# Patient Record
Sex: Female | Born: 1960 | Race: White | Hispanic: No | Marital: Single
Health system: Southern US, Community
[De-identification: ages and names within clinical notes are randomized; demographics above are authoritative.]

---

## 2001-03-13 ENCOUNTER — Other Ambulatory Visit: Admission: RE | Admit: 2001-03-13 | Discharge: 2001-03-13 | Payer: Self-pay | Admitting: Obstetrics and Gynecology

## 2001-03-24 ENCOUNTER — Encounter (INDEPENDENT_AMBULATORY_CARE_PROVIDER_SITE_OTHER): Payer: Self-pay

## 2001-03-24 ENCOUNTER — Ambulatory Visit (HOSPITAL_COMMUNITY): Admission: RE | Admit: 2001-03-24 | Discharge: 2001-03-24 | Payer: Self-pay | Admitting: Obstetrics and Gynecology

## 2001-03-24 ENCOUNTER — Encounter: Payer: Self-pay | Admitting: Obstetrics and Gynecology

## 2003-02-02 ENCOUNTER — Other Ambulatory Visit: Admission: RE | Admit: 2003-02-02 | Discharge: 2003-02-02 | Payer: Self-pay | Admitting: Obstetrics and Gynecology

## 2003-02-10 ENCOUNTER — Ambulatory Visit (HOSPITAL_COMMUNITY): Admission: RE | Admit: 2003-02-10 | Discharge: 2003-02-10 | Payer: Self-pay | Admitting: *Deleted

## 2003-02-10 ENCOUNTER — Encounter (INDEPENDENT_AMBULATORY_CARE_PROVIDER_SITE_OTHER): Payer: Self-pay

## 2004-11-28 ENCOUNTER — Other Ambulatory Visit: Admission: RE | Admit: 2004-11-28 | Discharge: 2004-11-28 | Payer: Self-pay | Admitting: Obstetrics and Gynecology

## 2005-11-29 ENCOUNTER — Emergency Department (HOSPITAL_COMMUNITY): Admission: EM | Admit: 2005-11-29 | Discharge: 2005-11-29 | Payer: Self-pay | Admitting: Emergency Medicine

## 2005-12-18 ENCOUNTER — Other Ambulatory Visit: Admission: RE | Admit: 2005-12-18 | Discharge: 2005-12-18 | Payer: Self-pay | Admitting: Obstetrics and Gynecology

## 2006-02-11 ENCOUNTER — Ambulatory Visit (HOSPITAL_COMMUNITY): Admission: RE | Admit: 2006-02-11 | Discharge: 2006-02-11 | Payer: Self-pay | Admitting: *Deleted

## 2007-03-30 IMAGING — CT CT HEAD W/O CM
1 series · 16 of 30 positions shown, 20 images · IV contrast (agent unspecified)
Comparison: None available.

CLINICAL DATA: Fall.  Headache.  Diplopia.  
 HEAD CT WITHOUT CONTRAST:
TECHNIQUE: Contiguous axial images were obtained from the base of the skull through the vertex according to standard protocol without contrast.

[Series 2: head_seq 4.5 h45s st · axial · 0.43mm/px · z∈[+1227,+1371]mm · 16 of 36 slices shown, 20 images]
[im 2/36  brain]
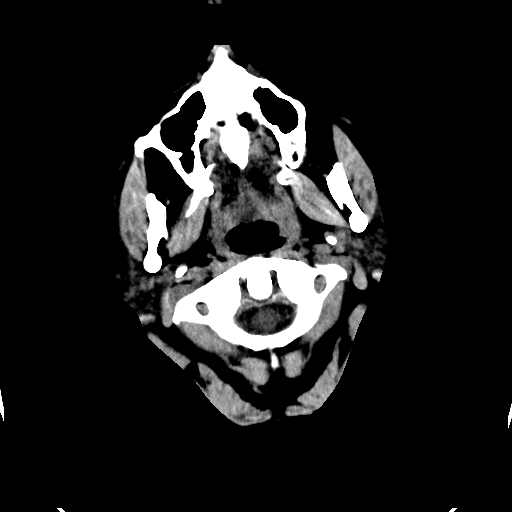
[im 2/36  bone]
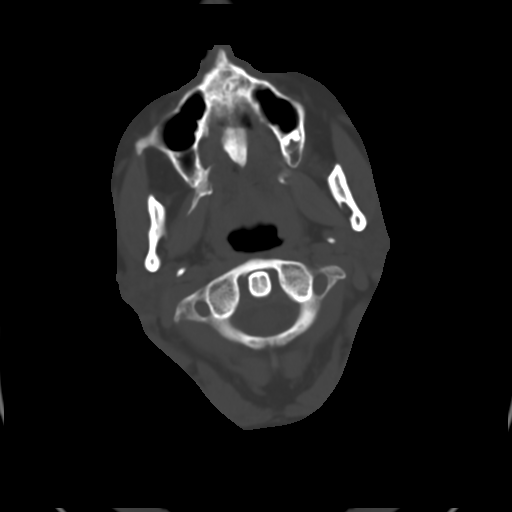
[im 4/36  brain]
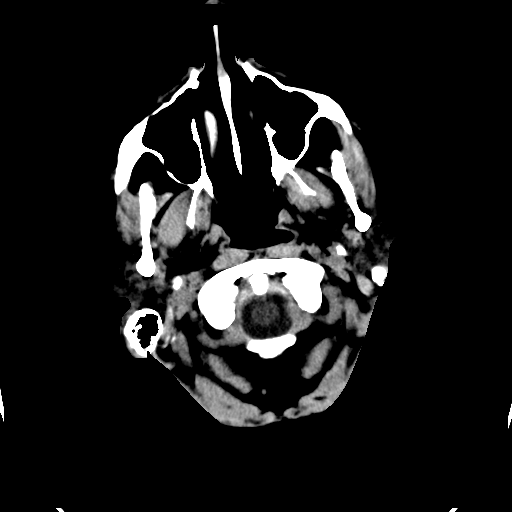
[im 7/36  brain]
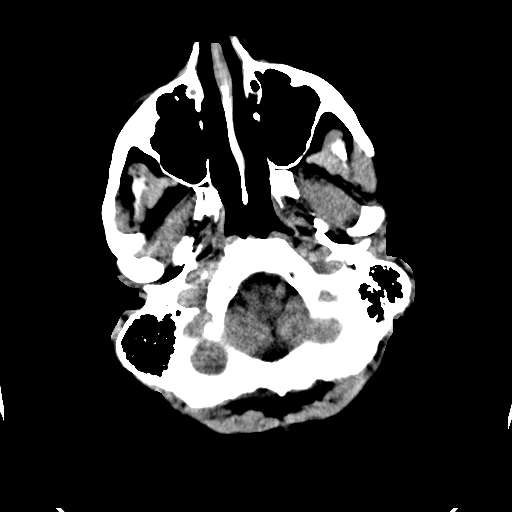
[im 9/36  brain]
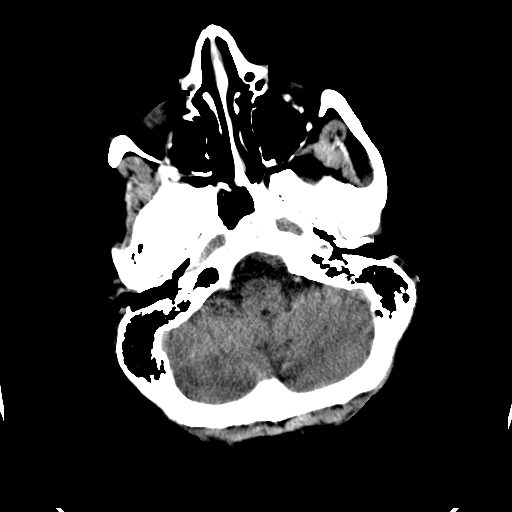
[im 10/36  brain]
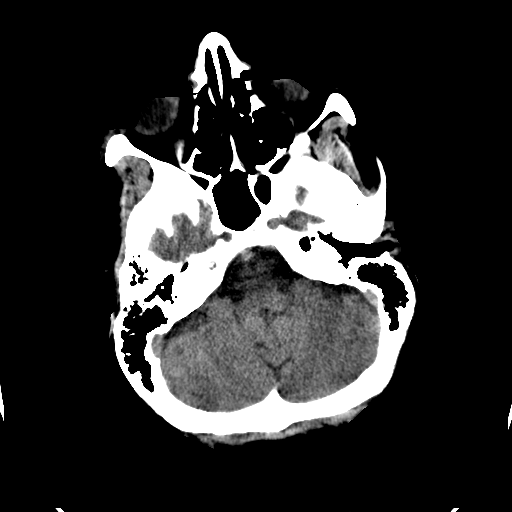
[im 10/36  bone]
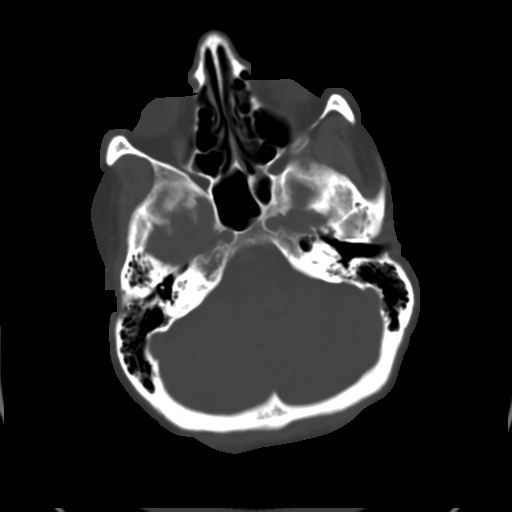
[im 13/36  brain]
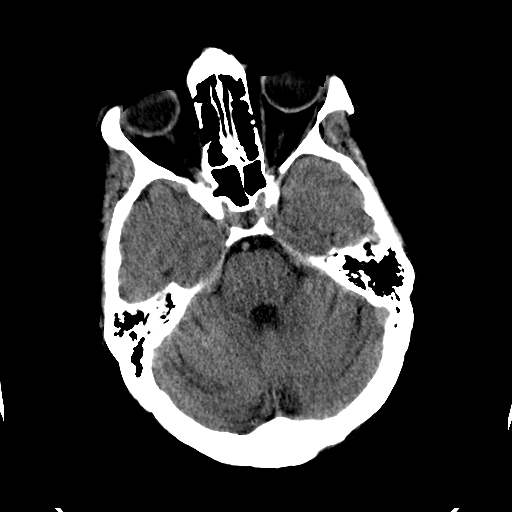
[im 15/36  brain]
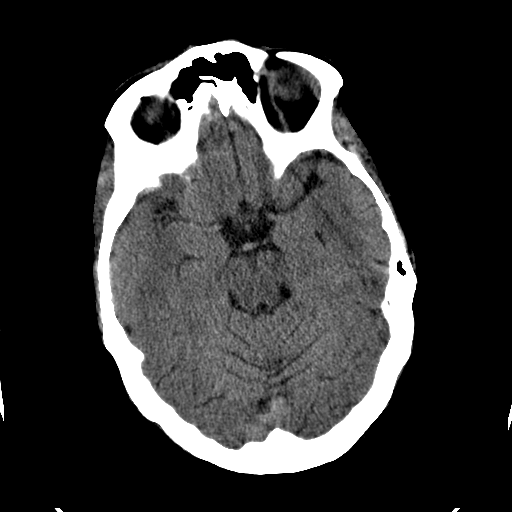
[im 17/36  brain]
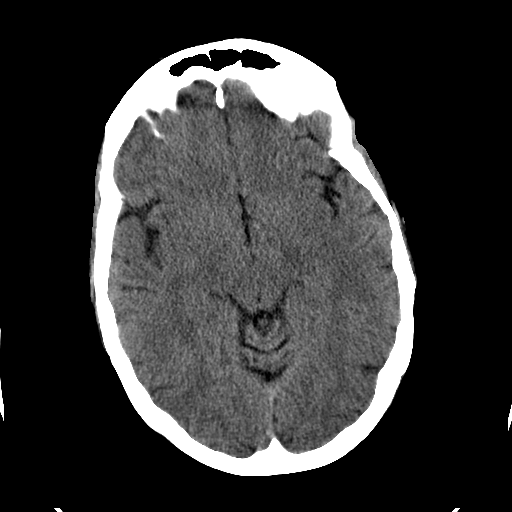
[im 19/36  brain]
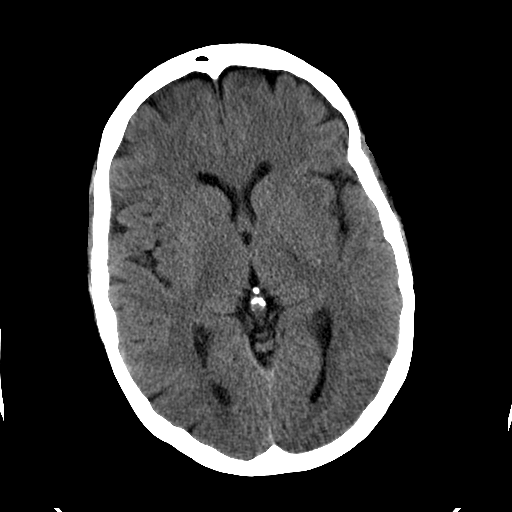
[im 19/36  bone]
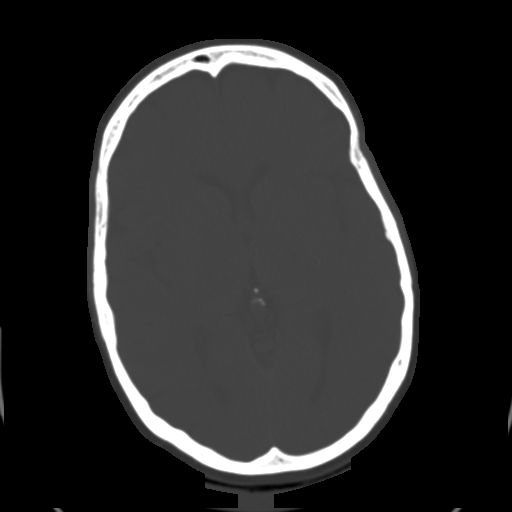
[im 21/36  brain]
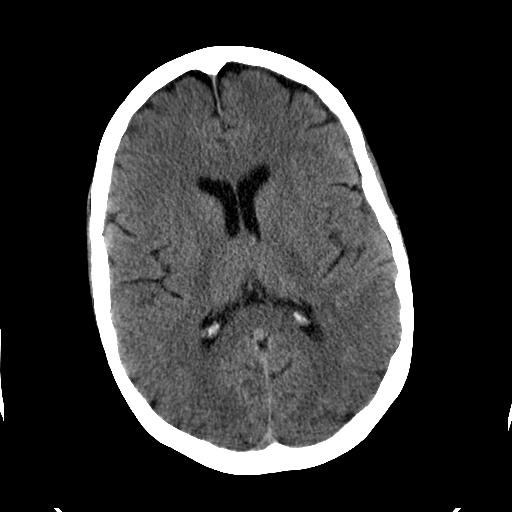
[im 23/36  brain]
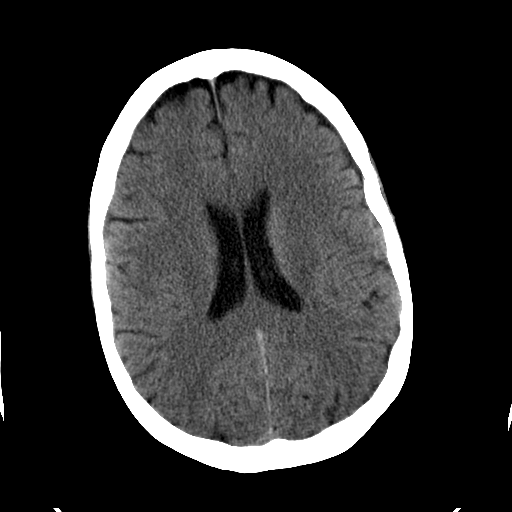
[im 26/36  brain]
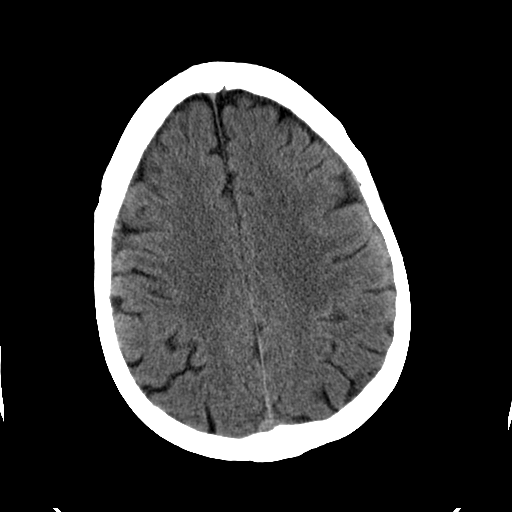
[im 27/36  brain]
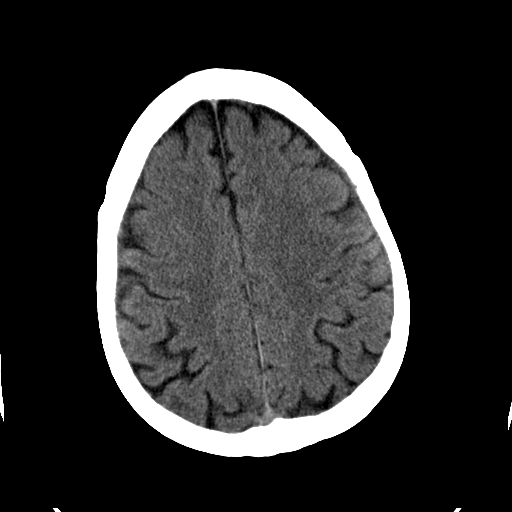
[im 27/36  bone]
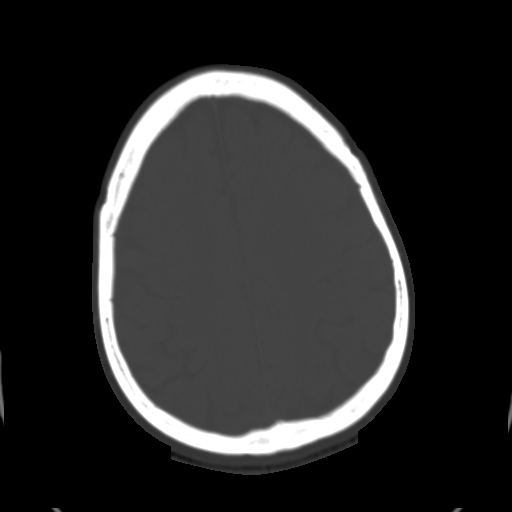
[im 29/36  brain]
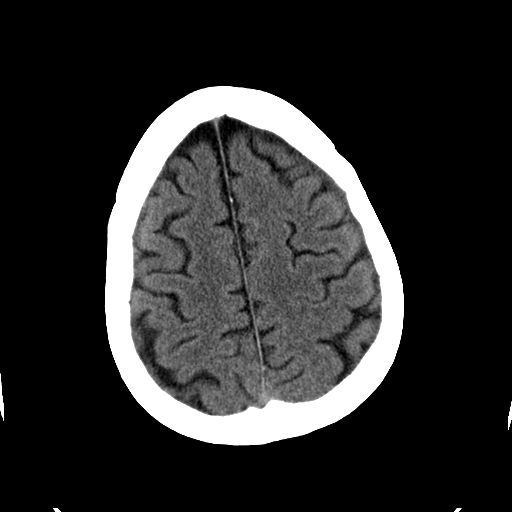
[im 32/36  brain]
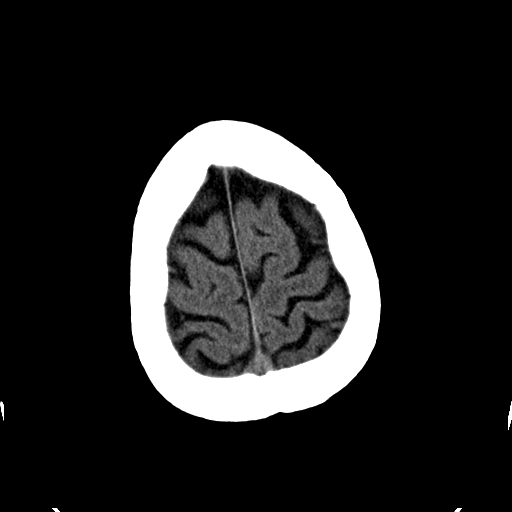
[im 34/36  brain]
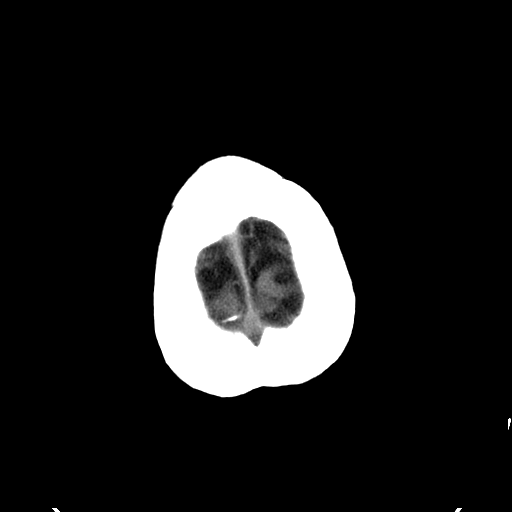

[16 of 30 positions shown; findings below may reference images not displayed]

There is no evidence of intracranial hemorrhage, brain edema, or mass effect.  No other intra-axial abnormalities are seen, and the ventricles are within normal limits.  No abnormal extra-axial fluid collections or masses are identified.  No skull abnormalities are noted.
IMPRESSION: Negative non-contrast head CT.

## 2009-07-28 ENCOUNTER — Ambulatory Visit: Payer: Self-pay | Admitting: Radiology

## 2009-07-28 ENCOUNTER — Emergency Department (HOSPITAL_BASED_OUTPATIENT_CLINIC_OR_DEPARTMENT_OTHER): Admission: EM | Admit: 2009-07-28 | Discharge: 2009-07-28 | Payer: Self-pay | Admitting: Emergency Medicine

## 2010-09-13 IMAGING — CR DG CHEST 2V
2 series · 2 of 2 positions shown · non-contrast
Comparison: None

CLINICAL DATA: Coughing congestion.  Smoker.  Abdominal pain.

CHEST - 2 VIEW

[w chest pa]
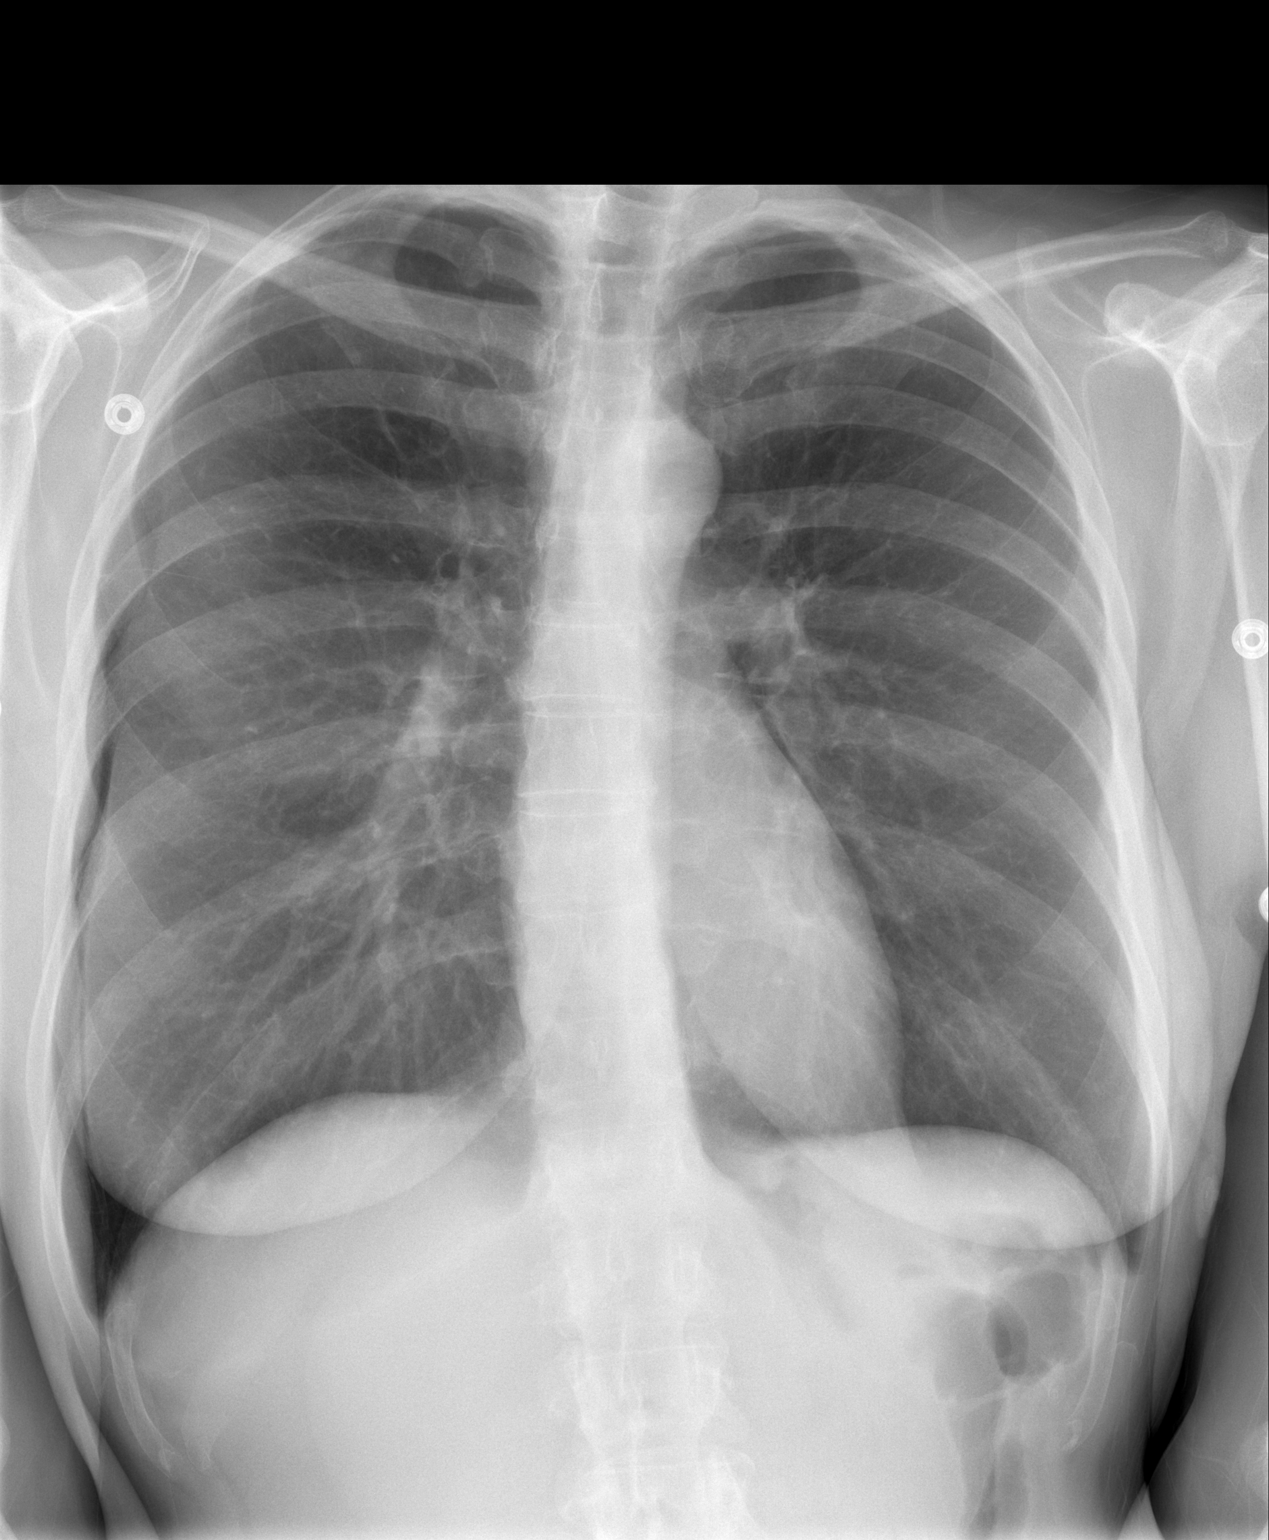

[w chest lat]
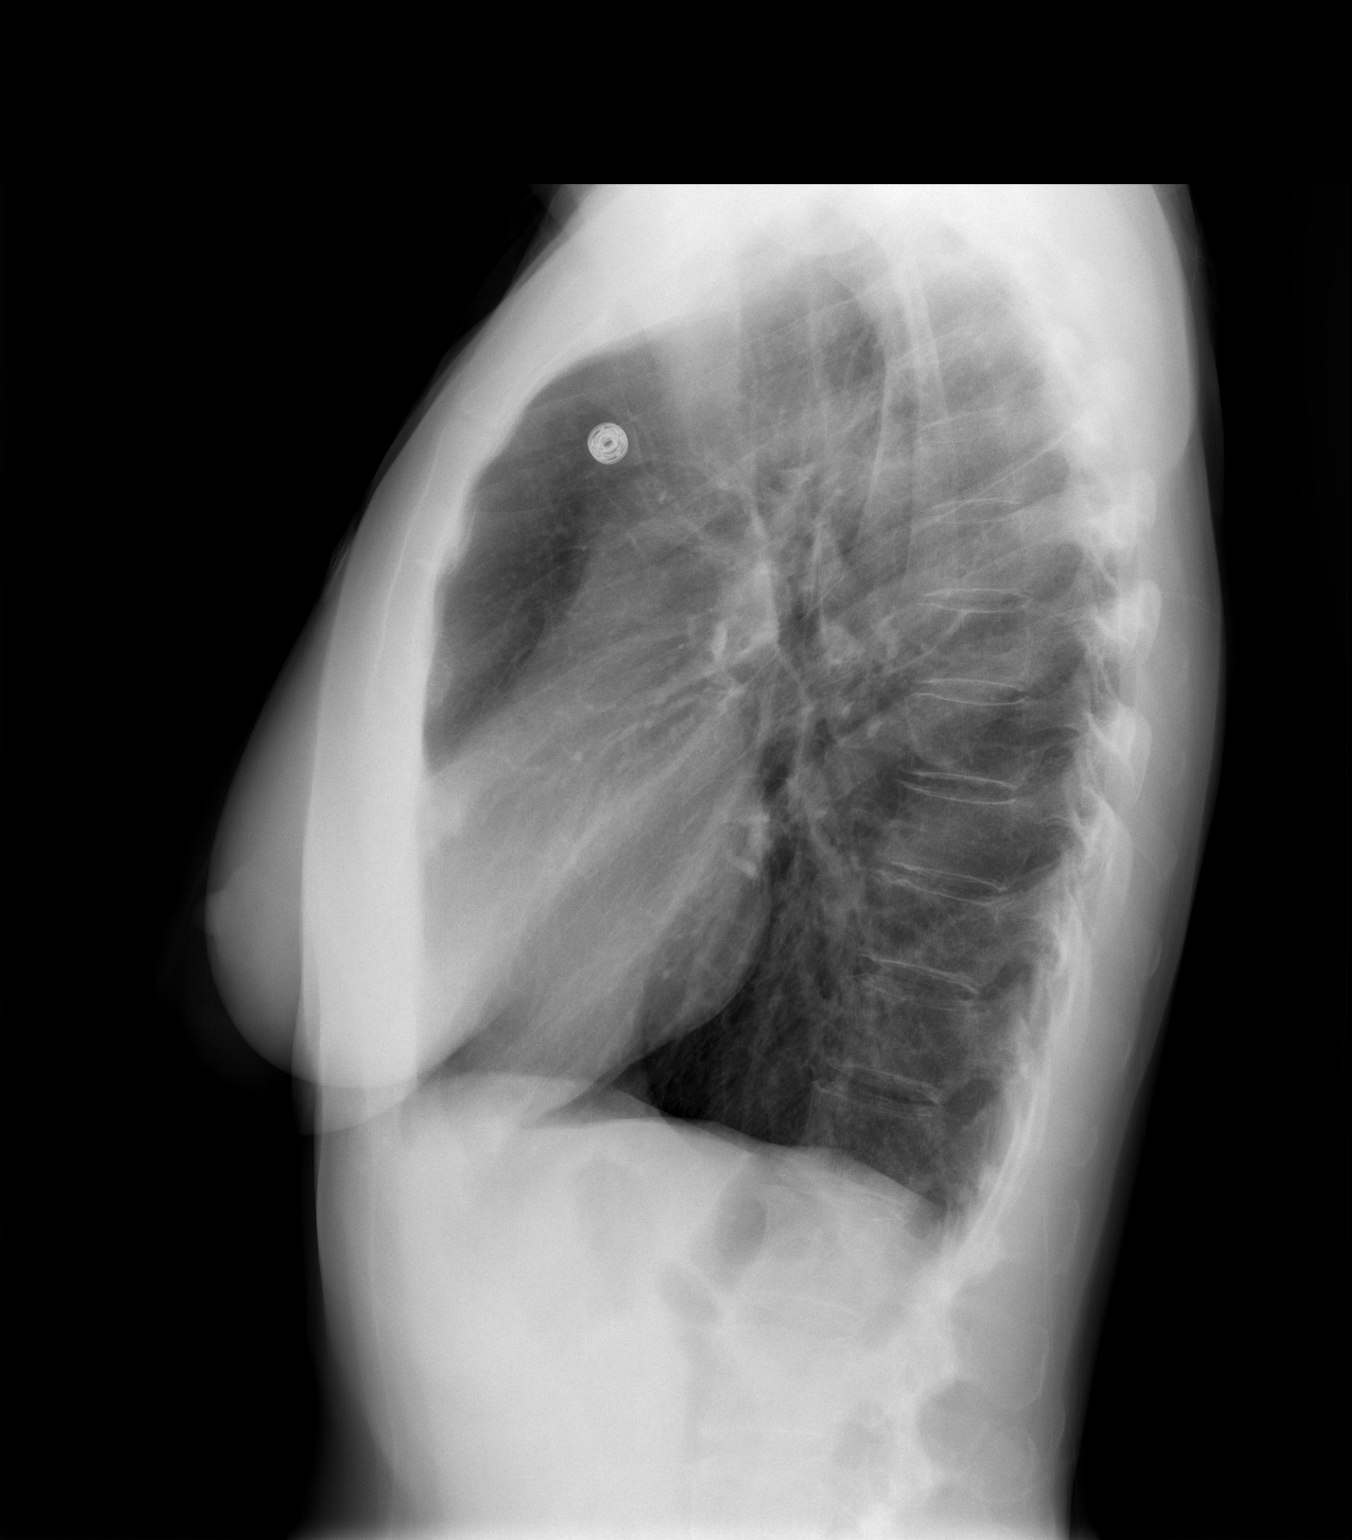

[2 of 2 positions shown; findings below may reference images not displayed]

FINDINGS: Normal cardiomediastinal silhouette.  Lungs well expanded
and clear of an active process.  Mild thoracic scoliosis with
convexity to the right.
IMPRESSION: No active cardiopulmonary disease.

## 2011-03-10 LAB — URINALYSIS, ROUTINE W REFLEX MICROSCOPIC
Bilirubin Urine: NEGATIVE
Glucose, UA: NEGATIVE mg/dL
Hgb urine dipstick: NEGATIVE
Ketones, ur: 15 mg/dL — AB
Leukocytes, UA: NEGATIVE
Nitrite: NEGATIVE
Protein, ur: 30 mg/dL — AB
Specific Gravity, Urine: 1.012 (ref 1.005–1.030)
Urobilinogen, UA: 1 mg/dL (ref 0.0–1.0)
pH: 7 (ref 5.0–8.0)

## 2011-03-10 LAB — COMPREHENSIVE METABOLIC PANEL
ALT: 386 U/L — ABNORMAL HIGH (ref 0–35)
AST: 811 U/L — ABNORMAL HIGH (ref 0–37)
Alkaline Phosphatase: 133 U/L — ABNORMAL HIGH (ref 39–117)
CO2: 27 mEq/L (ref 19–32)
Chloride: 103 mEq/L (ref 96–112)
GFR calc Af Amer: >60 mL/min (ref >60)
GFR calc non Af Amer: >60 mL/min (ref >60)
Glucose, Bld: 74 mg/dL (ref 70–99)
Sodium: 146 mEq/L — ABNORMAL HIGH (ref 135–145)
Total Bilirubin: 0.8 mg/dL (ref 0.3–1.2)

## 2011-03-10 LAB — LIPASE, BLOOD: Lipase: 123 U/L (ref 23–300)

## 2011-03-10 LAB — DIFFERENTIAL
Basophils Absolute: 0 10*3/uL (ref 0.0–0.1)
Eosinophils Absolute: 0.2 10*3/uL (ref 0.0–0.7)
Lymphs Abs: 2.6 10*3/uL (ref 0.7–4.0)
Monocytes Absolute: 0.3 10*3/uL (ref 0.1–1.0)
Neutrophils Relative %: 20 % — ABNORMAL LOW (ref 43–77)

## 2011-03-10 LAB — CBC
Hemoglobin: 14 g/dL (ref 12.0–15.0)
MCV: 103 fL — ABNORMAL HIGH (ref 78.0–100.0)
RBC: 3.83 MIL/uL — ABNORMAL LOW (ref 3.87–5.11)
WBC: 3.9 10*3/uL — ABNORMAL LOW (ref 4.0–10.5)

## 2011-03-10 LAB — URINE MICROSCOPIC-ADD ON

## 2011-03-10 LAB — POCT CARDIAC MARKERS: CKMB, poc: <1 ng/mL — ABNORMAL LOW (ref 1.0–8.0)

## 2011-03-10 LAB — ETHANOL: Alcohol, Ethyl (B): 313 mg/dL — ABNORMAL HIGH (ref 0–10)

## 2011-04-20 NOTE — Op Note (Signed)
[facility]  Patient:    Vanessa Cain, Vanessa Cain                        MRN: [id number] Proc. Date: 03/24/01 Adm. Date:  [id number] Attending:  Osborn Coho                           Operative Report  PREOPERATIVE DIAGNOSIS:  Menorrhagia.  POSTOPERATIVE DIAGNOSIS:  Menorrhagia with endometrial polyp.  PROCEDURES: 1. Dilation and curettage. 2. Polypectomy. 3. Hysteroscopy.  SURGEON:  Dr. Dareen Piano.  ANESTHESIA:  MAC with paracervical block.  DRAINS:  None.  ANTIBIOTICS:  Ancef 1 gm.  ESTIMATED BLOOD LOSS:  30 cc.  COMPLICATIONS:  NOne.  SPECIMENS:  Endocervical endometrial curettings sent to pathology.  DESCRIPTION OF PROCEDURE:  The patient was taken to the operating room where she was placed in the dorsal supine position and MAC anesthesia was administered. She was placed in the dorsal lithotomy position and prepped with Betadine. She was draped in the usual fashion for this procedure. A 20 cc of 1% lidocaine was used for paracervical block. The cervical os was dilated to a 31 [place]. The hysteroscope was advanced through the endocervical canal which appeared to be normal. By entering the uterine cavity, the patient was noted to have a large polyp on the anterior fundus slightly more to the left. At this point, the hysteroscope was removed and sharp curettage was performed on the endocervix and endometrium. All specimens were sent to pathology. The hysteroscope was placed back into the uterine cavity and a smooth clean lining was visualized, both ostia were seen. The patient tolerated the procedure well. She will be discharged to home. She will be sent home with Anaprox double strength to take p.r.n. She will follow-up in the office in four weeks. Pathology is pending at this time. DD:  03/24/01 TD:  03/25/01 Job: [id number]

## 2011-04-20 NOTE — Op Note (Signed)
   NAME:  Vanessa Cain, MCILHENNY NO.:  [id number]   MEDICAL RECORD NO.:  [id number]                   PATIENT TYPE:  AMB   LOCATION:  SDC                                  FACILITY:  [facility]   PHYSICIAN:  Malva Limes, M.D.                 DATE OF BIRTH:  December 01, 1961   DATE OF PROCEDURE:  02/10/2003  DATE OF DISCHARGE:                                 OPERATIVE REPORT   PREOPERATIVE DIAGNOSIS:  Menometrorrhagia.   POSTOPERATIVE DIAGNOSIS:  Menometrorrhagia.   PROCEDURES:  1. Dilatation and curettage.  2. Cryoablation of endometrium.   SURGEON:  Malva Limes, M.D.   ANESTHESIA:  MAC with paracervical block.   ANESTHESIA:  Ancef 1 gram.   DRAINS:  None.   ESTIMATED BLOOD LOSS:  Minimal.   SPECIMENS:  Endometrial curettings sent to pathology.   COMPLICATIONS:  None.   PROCEDURE:  The patient was taken to the operating room where she was placed  in the dorsal lithotomy position.  The patient was prepped with Hibiclens  and draped with green towels.  A sterile speculum was placed in the vagina.  Twenty mL of 1% lidocaine was used for a paracervical block.  A single-tooth  tenaculum was applied to the anterior cervical lip.  The cervical os was  dilated to a 48 [place].  The uterus was sounded to 8 cm.  At this point,  sharp curettage was performed.  Following this the cryoablation machine was  set up and the probe placed to 8 cm in the right cornua.  She was turned on,  freezing lasting seven minutes was performed.  The following procedure was  then done.  The probe was then placed on the opposite cornua and again seven  minutes of freezing was undertaken.  Once the probe was removed after the  second freezing the procedure was concluded.  The instruments were removed.  The patient was taken to the recovery room in stable condition.  She was  sent home with Motrin to take p.r.n.  She will follow up in the office in  four weeks.                         Malva Limes, M.D.    MA/MEDQ  D:  02/10/2003  T:  02/10/2003  Job:  [id number]

## 2020-03-05 ENCOUNTER — Ambulatory Visit: Payer: Self-pay | Attending: Internal Medicine

## 2020-03-05 DIAGNOSIS — Z23 Encounter for immunization: Secondary | ICD-10-CM

## 2020-03-05 NOTE — Progress Notes (Signed)
   Covid-19 Vaccination Clinic  Name:  Maylen Waltermire Kjos    MRN: [id number] DOB: 11-Sep-1961  03/05/2020  Ms. winstanley was observed post Covid-19 immunization for 15 minutes without incident. She was provided with Vaccine Information Sheet and instruction to access the V-Safe system.   Ms. husser was instructed to call 911 with any severe reactions post vaccine: Marland Kitchen Difficulty breathing  . Swelling of face and throat  . A fast heartbeat  . A bad rash all over body  . Dizziness and weakness   Immunizations Administered    Name Date Dose VIS Date Route   Pfizer COVID-19 Vaccine 03/05/2020  2:02 PM 0.3 mL 11/13/2019 Intramuscular   Manufacturer: [employer], [employer]   Lot: [id number]   NDC: [address]-3

## 2020-03-30 ENCOUNTER — Ambulatory Visit: Payer: Self-pay | Attending: Internal Medicine

## 2020-03-30 DIAGNOSIS — Z23 Encounter for immunization: Secondary | ICD-10-CM

## 2020-03-30 NOTE — Progress Notes (Signed)
   Covid-19 Vaccination Clinic  Name:  Vanessa Cain    MRN: [id number] DOB: 1961-09-04  03/30/2020  [name] was observed post Covid-19 immunization for 15 minutes without incident. She was provided with Vaccine Information Sheet and instruction to access the V-Safe system.   [name] was instructed to call 911 with any severe reactions post vaccine: Marland Kitchen Difficulty breathing  . Swelling of face and throat  . A fast heartbeat  . A bad rash all over body  . Dizziness and weakness   Immunizations Administered    Name Date Dose VIS Date Route   Pfizer COVID-19 Vaccine 03/30/2020  9:38 AM 0.3 mL 01/27/2019 Intramuscular   Manufacturer: [employer], [employer]   Lot: [id number]   NDC: [address]-8

## 2021-03-20 ENCOUNTER — Other Ambulatory Visit (HOSPITAL_COMMUNITY): Payer: Self-pay
